# Patient Record
Sex: Male | Born: 1990 | Hispanic: Yes | Marital: Single | Smoking: Never smoker
Health system: Southern US, Community
[De-identification: ages and names within clinical notes are randomized; demographics above are authoritative.]

## PROBLEM LIST (undated history)

## (undated) DIAGNOSIS — J45909 Unspecified asthma, uncomplicated: Secondary | ICD-10-CM

## (undated) HISTORY — PX: ORIF ORBITAL FRACTURE: SHX5312

---

## 2012-08-27 ENCOUNTER — Emergency Department (HOSPITAL_COMMUNITY)
Admission: EM | Admit: 2012-08-27 | Discharge: 2012-08-27 | Disposition: A | Discharge status: Home / Self Care | Payer: 59 | Attending: Emergency Medicine | Admitting: Emergency Medicine

## 2012-08-27 ENCOUNTER — Encounter (HOSPITAL_COMMUNITY): Payer: Self-pay | Admitting: *Deleted

## 2012-08-27 DIAGNOSIS — L819 Disorder of pigmentation, unspecified: Secondary | ICD-10-CM | POA: Insufficient documentation

## 2012-08-27 DIAGNOSIS — T7840XA Allergy, unspecified, initial encounter: Secondary | ICD-10-CM | POA: Insufficient documentation

## 2012-08-27 DIAGNOSIS — Y929 Unspecified place or not applicable: Secondary | ICD-10-CM | POA: Insufficient documentation

## 2012-08-27 DIAGNOSIS — Y939 Activity, unspecified: Secondary | ICD-10-CM | POA: Insufficient documentation

## 2012-08-27 DIAGNOSIS — R21 Rash and other nonspecific skin eruption: Secondary | ICD-10-CM | POA: Insufficient documentation

## 2012-08-27 DIAGNOSIS — T65891A Toxic effect of other specified substances, accidental (unintentional), initial encounter: Secondary | ICD-10-CM | POA: Insufficient documentation

## 2012-08-27 MED ORDER — PREDNISONE 20 MG PO TABS: 40.0000 mg | ORAL_TABLET | Freq: Every day | ORAL | Status: DC

## 2012-08-27 MED ORDER — FAMOTIDINE 20 MG PO TABS: 20.0000 mg | ORAL_TABLET | Freq: Two times a day (BID) | ORAL | Status: DC

## 2012-08-27 MED ORDER — HYDROCORTISONE 2.5 % EX LOTN: TOPICAL_LOTION | Freq: Two times a day (BID) | CUTANEOUS | Status: DC

## 2012-08-27 MED ORDER — DIPHENHYDRAMINE HCL 25 MG PO CAPS: 25.0000 mg | ORAL_CAPSULE | Freq: Four times a day (QID) | ORAL | Status: DC | PRN

## 2012-08-27 NOTE — ED Notes (Signed)
Pt got a tattoo to left forearm two wks ago; noticed redness x 1 wk; bruising noted

## 2012-08-27 NOTE — ED Provider Notes (Signed)
History     CSN: [id number]  Arrival date & time 08/27/12  1909   First MD Initiated Contact with Patient 08/27/12 2056      Chief Complaint  Patient presents with  . tattoo redness     (Consider location/radiation/quality/duration/timing/severity/associated sxs/prior treatment) HPI Comments: Patient presenting with a rash of the left forearm in the area of his tattoo.  Patient got the tattoo two weeks ago and first noticed the rash one week ago.  Rash is localized to the area of the tattoo.  He reports that the area does itch, but is not painful.  He has been applying A&D ointment to the tattoo for the past two weeks.  He has had previous tattoos and has never had a reaction like this before.  He denies fever or chills.  No warmth or swelling of the skin.  He has not had any treatment for the rash prior to arrival.    The history is provided by the patient.    History reviewed. No pertinent past medical history.  History reviewed. No pertinent past surgical history.  No family history on file.  History  Substance Use Topics  . Smoking status: Never Smoker   . Smokeless tobacco: Not on file  . Alcohol Use: Yes     Comment: social      Review of Systems  Constitutional: Negative for fever and chills.  Skin: Positive for rash.  All other systems reviewed and are negative.    Allergies  Review of patient's allergies indicates no known allergies.  Home Medications   Current Outpatient Rx  Name  Route  Sig  Dispense  Refill  . acetaminophen (TYLENOL) 500 MG tablet   Oral   Take 1,000 mg by mouth every 6 (six) hours as needed for pain.         . bacitracin ointment   Topical   Apply 1 application topically 2 (two) times daily. Applied to tattoo.           BP 114/59  Pulse 75  Temp(Src) 98.9 F (37.2 C) (Oral)  Resp 18  SpO2 100%  Physical Exam  Nursing note and vitals reviewed. Constitutional: He appears well-developed and well-nourished.  HENT:   Head: Normocephalic and atraumatic.  Mouth/Throat: Oropharynx is clear and moist.  Neck: Normal range of motion. Neck supple.  Cardiovascular: Normal rate, regular rhythm and normal heart sounds.   Pulmonary/Chest: Effort normal and breath sounds normal.  Musculoskeletal: Normal range of motion.  Neurological: He is alert.  Skin: Skin is warm.  Tattoo of left forearm with erythema surrounding each section of ink.  Papular erythematous rash present in the middle of the tattoo.  Rash pruritic.  No warmth or swelling of the area.    Psychiatric: He has a normal mood and affect.    ED Course  Procedures (including critical care time)  Labs Reviewed - No data to display No results found.   No diagnosis found.    MDM  Patient presenting with what appears to be an allergic reaction in the area of a tattoo on his left forearm.  No evidence of Cellulitis at this time.  Patient instructed to take Pepcid, Benadryl, and also given prescription for Prednisone.  Patient also instructed to use Hydrocortisone lotion if area not improving after a few days of Prednisone.  Patient verbalizes understanding.  Return precautions discussed.        Pascal Lux [place], PA-C 08/28/12 2221

## 2012-08-30 NOTE — ED Provider Notes (Signed)
Medical screening examination/treatment/procedure(s) were performed by non-physician practitioner and as supervising physician I was immediately available for consultation/collaboration.   Kevin M Campos, MD 08/30/12 0725 

## 2014-03-05 ENCOUNTER — Emergency Department (HOSPITAL_COMMUNITY)
Admission: EM | Admit: 2014-03-05 | Discharge: 2014-03-05 | Disposition: A | Discharge status: Home / Self Care | Payer: 59 | Attending: Emergency Medicine | Admitting: Emergency Medicine

## 2014-03-05 ENCOUNTER — Encounter (HOSPITAL_COMMUNITY): Payer: Self-pay | Admitting: Emergency Medicine

## 2014-03-05 DIAGNOSIS — Z792 Long term (current) use of antibiotics: Secondary | ICD-10-CM | POA: Insufficient documentation

## 2014-03-05 DIAGNOSIS — R519 Headache, unspecified: Secondary | ICD-10-CM

## 2014-03-05 DIAGNOSIS — Z79899 Other long term (current) drug therapy: Secondary | ICD-10-CM | POA: Insufficient documentation

## 2014-03-05 DIAGNOSIS — R51 Headache: Secondary | ICD-10-CM | POA: Insufficient documentation

## 2014-03-05 DIAGNOSIS — J3489 Other specified disorders of nose and nasal sinuses: Secondary | ICD-10-CM | POA: Insufficient documentation

## 2014-03-05 DIAGNOSIS — IMO0002 Reserved for concepts with insufficient information to code with codable children: Secondary | ICD-10-CM | POA: Insufficient documentation

## 2014-03-05 HISTORY — DX: Unspecified asthma, uncomplicated: J45.909

## 2014-03-05 MED ORDER — GUAIFENESIN 100 MG/5ML PO LIQD: 100.0000 mg | ORAL | Status: DC | PRN

## 2014-03-05 NOTE — ED Provider Notes (Signed)
CSN: [id number]     Arrival date & time 03/05/14  1122 History  This chart was scribed for Andrew Horseman, PA, working with Purvis Sheffield, MD found by Elon Spanner, ED Scribe. This patient was seen in room WTR6/WTR6 and the patient's care was started at 12:24 PM.   Chief Complaint  Patient presents with  . Facial Pain    pain on r/"cheek bone". Hx of fracture -r/orbit   The history is provided by the patient. No language interpreter was used.    HPI Comments: Andrew Sanford is a 23 y.o. male who presents to the Emergency Department with a chief complaint of right orbital and right sinus pain onset 6 weeks ago.  Patient reports a "tingling" sensation in the area of complaint.  Patient reports associated right-nostril only rhinorrhea onset with activity.  Patient states he has started using new safety glasses at his job but states this may not be the cause because he always wears some type of eye protection.  Patient has a history of right orbital surgical repair three years ago in [place]  Patient denies fever, injury, sore throat, cough, chills.    No past medical history on file. No past surgical history on file. No family history on file. History  Substance Use Topics  . Smoking status: Never Smoker   . Smokeless tobacco: Not on file  . Alcohol Use: Yes     Comment: social    Review of Systems  Constitutional: Negative for fever and chills.  HENT: Positive for rhinorrhea. Negative for sore throat.   Eyes: Negative for visual disturbance.  Respiratory: Negative for cough.   Neurological: Positive for numbness.      Allergies  Review of patient's allergies indicates no known allergies.  Home Medications   Prior to Admission medications   Medication Sig Start Date End Date Taking? Authorizing Provider  acetaminophen (TYLENOL) 500 MG tablet Take 1,000 mg by mouth every 6 (six) hours as needed for pain.    Historical Provider, MD  bacitracin ointment Apply 1  application topically 2 (two) times daily. Applied to tattoo.    Historical Provider, MD  diphenhydrAMINE (BENADRYL) 25 mg capsule Take 1 capsule (25 mg total) by mouth every 6 (six) hours as needed for itching. 08/27/12   Heather Laisure, PA-C  famotidine (PEPCID) 20 MG tablet Take 1 tablet (20 mg total) by mouth 2 (two) times daily. 08/27/12   Santiago Glad, PA-C  hydrocortisone 2.5 % lotion Apply topically 2 (two) times daily. 08/27/12   Heather Laisure, PA-C  predniSONE (DELTASONE) 20 MG tablet Take 2 tablets (40 mg total) by mouth daily. 08/27/12   Heather Laisure, PA-C   BP 122/73  Pulse 58  Temp(Src) 98 F (36.7 C) (Oral)  Resp 16  SpO2 99% Physical Exam  Nursing note and vitals reviewed. Constitutional: He is oriented to person, place, and time. He appears well-developed and well-nourished. No distress.  HENT:  Head: Normocephalic and atraumatic.  Bilateral nares are clear, no septal hematoma, tenderness to palpation over right maxillary sinus, no bony abnormality or deformity, normal sensation.    Eyes: Conjunctivae and EOM are normal.  Neck: Neck supple. No tracheal deviation present.  Cardiovascular: Normal rate, regular rhythm and normal heart sounds.   Pulmonary/Chest: Effort normal and breath sounds normal. No respiratory distress. He has no wheezes. He has no rales.  Musculoskeletal: Normal range of motion.  Neurological: He is alert and oriented to person, place, and time.  Skin: Skin is warm  and dry.  Psychiatric: He has a normal mood and affect. His behavior is normal.    ED Course  Procedures (including critical care time)  DIAGNOSTIC STUDIES: Oxygen Saturationh is 99% on RA, normal by my interpretation.    COORDINATION OF CARE:  12:31 PM Discussed treatment plan with patient at bedside.  Patient acknowledges and agrees with plan.    Labs Review Labs Reviewed - No data to display  Imaging Review No results found.   EKG Interpretation None      MDM    Final diagnoses:  Facial pain    Patient with tingling sensation on the right side of his face, mainly under the eye.  No EOM entrapment or concerning symptoms, no sign of infection.  No vision changes or headaches.  Discussed with Dr. Romeo Apple, who agrees with ENT follow-up. Patient is stable and ready for discharge.  I personally performed the services described in this documentation, which was scribed in my presence. The recorded information has been reviewed and is accurate.     Andrew Horseman, PA-C 03/05/14 1450

## 2014-03-05 NOTE — Discharge Instructions (Signed)

## 2014-03-05 NOTE — Progress Notes (Signed)
[facility] Community Liaison Stacy, ° °Did not get to see patient but will be sending information about [facility] Orange Card program to help patient establish primary care, using the address provided.  °

## 2014-03-05 NOTE — ED Provider Notes (Signed)
Medical screening examination/treatment/procedure(s) were performed by non-physician practitioner and as supervising physician I was immediately available for consultation/collaboration.   EKG Interpretation None        Lashana Spang, MD 03/05/14 1605 
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
## 2014-04-30 ENCOUNTER — Emergency Department (HOSPITAL_COMMUNITY): Payer: 59

## 2014-04-30 ENCOUNTER — Encounter (HOSPITAL_COMMUNITY): Payer: Self-pay | Admitting: Emergency Medicine

## 2014-04-30 ENCOUNTER — Emergency Department (HOSPITAL_COMMUNITY)
Admission: EM | Admit: 2014-04-30 | Discharge: 2014-04-30 | Disposition: A | Discharge status: Home / Self Care | Payer: Self-pay | Attending: Emergency Medicine | Admitting: Emergency Medicine

## 2014-04-30 DIAGNOSIS — Y9389 Activity, other specified: Secondary | ICD-10-CM | POA: Insufficient documentation

## 2014-04-30 DIAGNOSIS — S161XXA Strain of muscle, fascia and tendon at neck level, initial encounter: Secondary | ICD-10-CM | POA: Insufficient documentation

## 2014-04-30 DIAGNOSIS — K029 Dental caries, unspecified: Secondary | ICD-10-CM | POA: Insufficient documentation

## 2014-04-30 DIAGNOSIS — Y92410 Unspecified street and highway as the place of occurrence of the external cause: Secondary | ICD-10-CM | POA: Insufficient documentation

## 2014-04-30 DIAGNOSIS — Z7952 Long term (current) use of systemic steroids: Secondary | ICD-10-CM | POA: Insufficient documentation

## 2014-04-30 DIAGNOSIS — Z79899 Other long term (current) drug therapy: Secondary | ICD-10-CM | POA: Insufficient documentation

## 2014-04-30 DIAGNOSIS — S0083XA Contusion of other part of head, initial encounter: Secondary | ICD-10-CM | POA: Insufficient documentation

## 2014-04-30 DIAGNOSIS — Y998 Other external cause status: Secondary | ICD-10-CM | POA: Insufficient documentation

## 2014-04-30 DIAGNOSIS — J45909 Unspecified asthma, uncomplicated: Secondary | ICD-10-CM | POA: Insufficient documentation

## 2014-04-30 DIAGNOSIS — S0011XA Contusion of right eyelid and periocular area, initial encounter: Secondary | ICD-10-CM | POA: Insufficient documentation

## 2014-04-30 MED ORDER — NAPROXEN 500 MG PO TABS: 500.0000 mg | ORAL_TABLET | Freq: Two times a day (BID) | ORAL | Status: DC

## 2014-04-30 MED ORDER — CYCLOBENZAPRINE HCL 10 MG PO TABS: 10.0000 mg | ORAL_TABLET | Freq: Two times a day (BID) | ORAL | Status: DC | PRN

## 2014-04-30 NOTE — Discharge Instructions (Signed)
Take naprosyn for pain. Flexeril for muscle spasms. Follow up with your doctor as needed. Your x-rays are normal today.    Cervical Sprain A cervical sprain is when the tissues (ligaments) that hold the neck bones in place stretch or tear. HOME CARE   Put ice on the injured area.  Put ice in a plastic bag.  Place a towel between your skin and the bag.  Leave the ice on for 15-20 minutes, 3-4 times a day.  You may have been given a collar to wear. This collar keeps your neck from moving while you heal.  Do not take the collar off unless told by your doctor.  If you have long hair, keep it outside of the collar.  Ask your doctor before changing the position of your collar. You may need to change its position over time to make it more comfortable.  If you are allowed to take off the collar for cleaning or bathing, follow your doctor's instructions on how to do it safely.  Keep your collar clean by wiping it with mild soap and water. Dry it completely. If the collar has removable pads, remove them every 1-2 days to hand wash them with soap and water. Allow them to air dry. They should be dry before you wear them in the collar.  Do not drive while wearing the collar.  Only take medicine as told by your doctor.  Keep all doctor visits as told.  Keep all physical therapy visits as told.  Adjust your work station so that you have good posture while you work.  Avoid positions and activities that make your problems worse.  Warm up and stretch before being active. GET HELP IF:  Your pain is not controlled with medicine.  You cannot take less pain medicine over time as planned.  Your activity level does not improve as expected. GET HELP RIGHT AWAY IF:   You are bleeding.  Your stomach is upset.  You have an allergic reaction to your medicine.  You develop new problems that you cannot explain.  You lose feeling (become numb) or you cannot move any part of your body  (paralysis).  You have tingling or weakness in any part of your body.  Your symptoms get worse. Symptoms include:  Pain, soreness, stiffness, puffiness (swelling), or a burning feeling in your neck.  Pain when your neck is touched.  Shoulder or upper back pain.  Limited ability to move your neck.  Headache.  Dizziness.  Your hands or arms feel week, lose feeling, or tingle.  Muscle spasms.  Difficulty swallowing or chewing. MAKE SURE YOU:   Understand these instructions.  Will watch your condition.  Will get help right away if you are not doing well or get worse. Document Released: 11/28/2007 Document Revised: 02/11/2013 Document Reviewed: 12/17/2012 [facility] Patient Information 2015 [place], [place]. This information is not intended to replace advice given to you by your health care provider. Make sure you discuss any questions you have with your health care provider.   Contusion A contusion is a deep bruise. Contusions are the result of an injury that caused bleeding under the skin. The contusion may turn blue, purple, or yellow. Minor injuries will give you a painless contusion, but more severe contusions may stay painful and swollen for a few weeks.  CAUSES  A contusion is usually caused by a blow, trauma, or direct force to an area of the body. SYMPTOMS   Swelling and redness of the injured area.  Bruising of the injured area.  Tenderness and soreness of the injured area.  Pain. DIAGNOSIS  The diagnosis can be made by taking a history and physical exam. An X-ray, CT scan, or MRI may be needed to determine if there were any associated injuries, such as fractures. TREATMENT  Specific treatment will depend on what area of the body was injured. In general, the best treatment for a contusion is resting, icing, elevating, and applying cold compresses to the injured area. Over-the-counter medicines may also be recommended for pain control. Ask your caregiver what the  best treatment is for your contusion. HOME CARE INSTRUCTIONS   Put ice on the injured area.  Put ice in a plastic bag.  Place a towel between your skin and the bag.  Leave the ice on for 15-20 minutes, 3-4 times a day, or as directed by your health care provider.  Only take over-the-counter or prescription medicines for pain, discomfort, or fever as directed by your caregiver. Your caregiver may recommend avoiding anti-inflammatory medicines (aspirin, ibuprofen, and naproxen) for 48 hours because these medicines may increase bruising.  Rest the injured area.  If possible, elevate the injured area to reduce swelling. SEEK IMMEDIATE MEDICAL CARE IF:   You have increased bruising or swelling.  You have pain that is getting worse.  Your swelling or pain is not relieved with medicines. MAKE SURE YOU:   Understand these instructions.  Will watch your condition.  Will get help right away if you are not doing well or get worse. Document Released: 03/21/2005 Document Revised: 06/16/2013 Document Reviewed: 04/16/2011 [facility]moreExitCare Patient Information 2015 [place], [place]. This information is not intended to replace advice given to you by your health care provider. Make sure you discuss any questions you have with your health care provider.
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
## 2014-08-23 ENCOUNTER — Emergency Department (HOSPITAL_COMMUNITY)
Admission: EM | Admit: 2014-08-23 | Discharge: 2014-08-23 | Disposition: A | Discharge status: Home / Self Care | Payer: 59

## 2014-08-23 NOTE — ED Notes (Signed)
Pt came to desk stating that he needed to get his wife and would come back at later time.

## 2014-08-31 ENCOUNTER — Encounter (HOSPITAL_COMMUNITY): Payer: Self-pay | Admitting: Emergency Medicine

## 2014-08-31 ENCOUNTER — Emergency Department (HOSPITAL_COMMUNITY): Payer: BLUE CROSS/BLUE SHIELD

## 2014-08-31 ENCOUNTER — Emergency Department (HOSPITAL_COMMUNITY)
Admission: EM | Admit: 2014-08-31 | Discharge: 2014-08-31 | Disposition: A | Discharge status: Home / Self Care | Payer: BLUE CROSS/BLUE SHIELD | Attending: Emergency Medicine | Admitting: Emergency Medicine

## 2014-08-31 DIAGNOSIS — Z7952 Long term (current) use of systemic steroids: Secondary | ICD-10-CM | POA: Diagnosis not present

## 2014-08-31 DIAGNOSIS — Z79899 Other long term (current) drug therapy: Secondary | ICD-10-CM | POA: Insufficient documentation

## 2014-08-31 DIAGNOSIS — Z791 Long term (current) use of non-steroidal anti-inflammatories (NSAID): Secondary | ICD-10-CM | POA: Insufficient documentation

## 2014-08-31 DIAGNOSIS — R002 Palpitations: Secondary | ICD-10-CM

## 2014-08-31 DIAGNOSIS — J45909 Unspecified asthma, uncomplicated: Secondary | ICD-10-CM | POA: Diagnosis not present

## 2014-08-31 LAB — I-STAT CHEM 8, ED
BUN: 24 mg/dL — ABNORMAL HIGH (ref 6–23)
Calcium, Ion: 1.23 mmol/L (ref 1.12–1.23)
Chloride: 100 mmol/L (ref 96–112)
Creatinine, Ser: 1.1 mg/dL (ref 0.50–1.35)
Glucose, Bld: 82 mg/dL (ref 70–99)
HCT: 45 % (ref 39.0–52.0)
Hemoglobin: 15.3 g/dL (ref 13.0–17.0)
Potassium: 3.6 mmol/L (ref 3.5–5.1)
Sodium: 140 mmol/L (ref 135–145)
TCO2: 25 mmol/L (ref 0–100)

## 2014-08-31 LAB — I-STAT TROPONIN, ED: Troponin i, poc: 0 ng/mL (ref 0.00–0.08)

## 2014-08-31 NOTE — ED Notes (Signed)
Pt c/o heart palpitations, ears will turn red and around his eyes will turn red also intermittently x 2 weeks.

## 2014-08-31 NOTE — Discharge Instructions (Signed)
Please follow up with cardiology office for further evaluation of your heart palpitations.  Palpitations A palpitation is the feeling that your heartbeat is irregular or is faster than normal. It may feel like your heart is fluttering or skipping a beat. Palpitations are usually not a serious problem. However, in some cases, you may need further medical evaluation. CAUSES  Palpitations can be caused by:  Smoking.  Caffeine or other stimulants, such as diet pills or energy drinks.  Alcohol.  Stress and anxiety.  Strenuous physical activity.  Fatigue.  Certain medicines.  Heart disease, especially if you have a history of irregular heart rhythms (arrhythmias), such as atrial fibrillation, atrial flutter, or supraventricular tachycardia.  An improperly working pacemaker or defibrillator. DIAGNOSIS  To find the cause of your palpitations, your health care provider will take your medical history and perform a physical exam. Your health care provider may also have you take a test called an ambulatory electrocardiogram (ECG). An ECG records your heartbeat patterns over a 24-hour period. You may also have other tests, such as:  Transthoracic echocardiogram (TTE). During echocardiography, sound waves are used to evaluate how blood flows through your heart.  Transesophageal echocardiogram (TEE).  Cardiac monitoring. This allows your health care provider to monitor your heart rate and rhythm in real time.  Holter monitor. This is a portable device that records your heartbeat and can help diagnose heart arrhythmias. It allows your health care provider to track your heart activity for several days, if needed.  Stress tests by exercise or by giving medicine that makes the heart beat faster. TREATMENT  Treatment of palpitations depends on the cause of your symptoms and can vary greatly. Most cases of palpitations do not require any treatment other than time, relaxation, and monitoring your  symptoms. Other causes, such as atrial fibrillation, atrial flutter, or supraventricular tachycardia, usually require further treatment. HOME CARE INSTRUCTIONS   Avoid:  Caffeinated coffee, tea, soft drinks, diet pills, and energy drinks.  Chocolate.  Alcohol.  Stop smoking if you smoke.  Reduce your stress and anxiety. Things that can help you relax include:  A method of controlling things in your body, such as your heartbeats, with your mind (biofeedback).  Yoga.  Meditation.  Physical activity such as swimming, jogging, or walking.  Get plenty of rest and sleep. SEEK MEDICAL CARE IF:   You continue to have a fast or irregular heartbeat beyond 24 hours.  Your palpitations occur more often. SEEK IMMEDIATE MEDICAL CARE IF:  You have chest pain or shortness of breath.  You have a severe headache.  You feel dizzy or you faint. MAKE SURE YOU:  Understand these instructions.  Will watch your condition.  Will get help right away if you are not doing well or get worse. Document Released: 06/08/2000 Document Revised: 06/16/2013 Document Reviewed: 08/10/2011 [facility] Patient Information 2015 [place], [place]. This information is not intended to replace advice given to you by your health care provider. Make sure you discuss any questions you have with your health care provider. Holter Monitoring A Holter monitor is a small device with electrodes (small sticky patches) that attach to your chest. It records the electrical activity of your heart and is worn continuously for 24-48 hours.  A HOLTER MONITOR IS USED TO  Detect heart problems such as:  Heart arrhythmia. Is an abnormal or irregular heartbeat. With some heart arrhythmias, you may not feel or know that you have an irregular heart rhythm.  Palpitations, such as feeling your heart  racing or fluttering. It is possible to have heart palpitations and not have a heart arrhythmia.  A heart rhythm that is too slow or too  fast.  If you have problems fainting, near fainting or feeling light-headed, a Holter monitor may be worn to see if your heart is the cause. HOLTER MONITOR PREPARATION   Electrodes will be attached to the skin on your chest.  If you have hair on your chest, small areas may have to be shaved. This is done to help the patches stick better and make the recording more accurate.  The electrodes are attached by wires to the Holter monitor. The Holter monitor clips to your clothing. You will wear the monitor at all times, even while exercising and sleeping. HOME CARE INSTRUCTIONS   Wear your monitor at all times.  The wires and the monitor must stay dry. Do not get the monitor wet.  Do not bathe, swim or use a hot tub with it on.  You may do a "sponge" bath while you have the monitor on.  Keep your skin clean, do not put body lotion or moisturizer on your chest.  It's possible that your skin under the electrodes could become irritated. To keep this from happening, you may put the electrodes in slightly different places on your chest.  Your caregiver will also ask you to keep a diary of your activities, such as walking or doing chores. Be sure to note what you are doing if you experience heart symptoms such as palpitations. This will help your caregiver determine what might be contributing to your symptoms. The information stored in your monitor will be reviewed by your caregiver alongside your diary entries.  Make sure the monitor is safely clipped to your clothing or in a location close to your body that your caregiver recommends.  The monitor and electrodes are removed when the test is over. Return the monitor as directed.  Be sure to follow up with your caregiver and discuss your Holter monitor results. SEEK IMMEDIATE MEDICAL CARE IF:  You faint or feel lightheaded.  You have trouble breathing.  You get pain in your chest, upper arm or jaw.  You feel sick to your stomach and your  skin is pale, cool, or damp.  You think something is wrong with the way your heart is beating. MAKE SURE YOU:   Understand these instructions.  Will watch your condition.  Will get help right away if you are not doing well or get worse. Document Released: 03/09/2004 Document Revised: 09/03/2011 Document Reviewed: 07/22/2008 [facility] Patient Information 2015 [place]. This information is not intended to replace advice given to you by your health care provider. Make sure you discuss any questions you have with your health care provider.

## 2014-08-31 NOTE — ED Provider Notes (Signed)
CSN: [id number]     Arrival date & time 08/31/14  1626 History   First MD Initiated Contact with Patient 08/31/14 1840     Chief Complaint  Patient presents with  . Palpitations     (Consider location/radiation/quality/duration/timing/severity/associated sxs/prior Treatment) HPI   24 year old male with history of asthma presenting for evaluation of heart palpitation. Patient reports he has been expressing intermittent heart palpitation that has worsened in the past 2 weeks. Describes symptoms as heart beating fast, a first sensation to his face, and feeling very fatigued. Episode usually triggers by exertion, and lasting for seconds to minutes and improved with rest. Most recent episode was today while he was working with the machine at work. He admits that he did drink energy drinks at least 1-2 bottle daily but since his heart palpitation has increased in frequency he has quit for the past 2 weeks. He denies caffeine use. He denies any other medication changes. He has childhood asthma but has not had a flare recently. He did report a strong family history of heart palpitation in his brother and his father, both on medication for that. He denies having any fever, headache, vision changes, pleuritic chest pain, abdominal pain, numbness or weakness. He has no symptoms at this time. Denies any prior history of exertional syncope. Patient does not have a primary care doctor and has never been evaluated for this in the past.    Past Medical History  Diagnosis Date  . Asthma    Past Surgical History  Procedure Laterality Date  . Orif orbital fracture     Family History  Problem Relation Age of Onset  . Hypertension Father   . Hypertension Brother   . Diabetes Other    History  Substance Use Topics  . Smoking status: Never Smoker   . Smokeless tobacco: Not on file  . Alcohol Use: Yes    Review of Systems  All other systems reviewed and are negative.     Allergies  Review of  patient's allergies indicates no known allergies.  Home Medications   Prior to Admission medications   Medication Sig Start Date End Date Taking? Authorizing Provider  acetaminophen (TYLENOL) 500 MG tablet Take 1,000 mg by mouth every 6 (six) hours as needed for pain.    Historical Provider, MD  bacitracin ointment Apply 1 application topically 2 (two) times daily. Applied to tattoo.    Historical Provider, MD  cyclobenzaprine (FLEXERIL) 10 MG tablet Take 1 tablet (10 mg total) by mouth 2 (two) times daily as needed for muscle spasms. 04/30/14   Tatyana Kirichenko, PA-C  diphenhydrAMINE (BENADRYL) 25 mg capsule Take 1 capsule (25 mg total) by mouth every 6 (six) hours as needed for itching. 08/27/12   Heather Laisure, PA-C  famotidine (PEPCID) 20 MG tablet Take 1 tablet (20 mg total) by mouth 2 (two) times daily. 08/27/12   Heather Laisure, PA-C  guaiFENesin (ROBITUSSIN) 100 MG/5ML liquid Take 5-10 mLs (100-200 mg total) by mouth every 4 (four) hours as needed for cough. 03/05/14   Roxy Horseman, PA-C  hydrocortisone 2.5 % lotion Apply topically 2 (two) times daily. 08/27/12   Heather Laisure, PA-C  naproxen (NAPROSYN) 500 MG tablet Take 1 tablet (500 mg total) by mouth 2 (two) times daily. 04/30/14   Tatyana Kirichenko, PA-C  predniSONE (DELTASONE) 20 MG tablet Take 2 tablets (40 mg total) by mouth daily. 08/27/12   Heather Laisure, PA-C   BP 117/58 mmHg  Pulse 66  Temp(Src) 98.2 F (36.8  C) (Oral)  Resp 16  Ht 5\' 6"  (1.676 m)  Wt 156 lb (70.761 kg)  BMI 25.19 kg/m2  SpO2 100% Physical Exam  Constitutional: He is oriented to person, place, and time. He appears well-developed and well-nourished. No distress.  HENT:  Head: Atraumatic.  Eyes: Conjunctivae and EOM are normal. Pupils are equal, round, and reactive to light.  Neck: Normal range of motion. Neck supple.  Cardiovascular: Normal rate and regular rhythm.  Exam reveals no gallop and no friction rub.   No murmur heard. Pulmonary/Chest:  Effort normal and breath sounds normal.  Abdominal: Soft. There is no tenderness.  Musculoskeletal: Normal range of motion. He exhibits no edema.  Neurological: He is alert and oriented to person, place, and time.  Skin: No rash noted.  Psychiatric: He has a normal mood and affect.    ED Course  Procedures (including critical care time)  Patient is here for intermittent heart palpitation with a strong family history of heart palpitation. He is currently asymptomatic. He is hemodynamically stable, afebrile and no hypoxia. EKG shows no irregular heart rhythm, troponin is normal. He has a normal orthostatic vital sign, cxr normal.  Recommend pt to f/u with cardiology for outpt evaluation.  Suspect pt may need Holter monitoring if indicated.  Labs Review Labs Reviewed  Rosezena Sensor-STAT TROPOININ, ED    Imaging Review Dg Chest 2 View  08/31/2014   CLINICAL DATA:  heart palpitations, ears will turn red and around his eyes will turn red also intermittently x 2 weeks. Hx of asthma as a child.  EXAM: CHEST - 2 VIEW  COMPARISON:  None available  FINDINGS: Lungs are clear. Heart size and mediastinal contours are within normal limits. No effusion. Visualized skeletal structures are unremarkable.  IMPRESSION: No acute cardiopulmonary disease.   Electronically Signed   By: Corlis Leak  Hassell M.D.   On: 08/31/2014 20:08     EKG Interpretation   Date/Time:  Tuesday August 31 2014 17:01:52 EST Ventricular Rate:  66 PR Interval:  133 QRS Duration: 96 QT Interval:  401 QTC Calculation: 420 R Axis:   88 Text Interpretation:  Sinus rhythm Probable anteroseptal infarct, old No  old tracing to compare Confirmed by KNAPP  MD-J, JON ([id number]) on 08/31/2014  5:14:55 PM      MDM   Final diagnoses:  Intermittent palpitations  Palpitation    BP 119/63 mmHg  Pulse 63  Temp(Src) 98 F (36.7 C) (Oral)  Resp 16  Ht 5\' 6"  (1.676 m)  Wt 156 lb (70.761 kg)  BMI 25.19 kg/m2  SpO2 100%  I have reviewed nursing notes and  vital signs. I personally reviewed the imaging tests through PACS system  I reviewed available ER/hospitalization records thought the EMR     Fayrene HelperBowie Anna Beaird, PA-C 08/31/14 2042  Linwood DibblesJon Knapp, MD 08/31/14 2046

## 2015-04-15 ENCOUNTER — Emergency Department (HOSPITAL_COMMUNITY): Payer: BLUE CROSS/BLUE SHIELD

## 2015-04-15 ENCOUNTER — Encounter (HOSPITAL_COMMUNITY): Payer: Self-pay | Admitting: Emergency Medicine

## 2015-04-15 ENCOUNTER — Emergency Department (HOSPITAL_COMMUNITY)
Admission: EM | Admit: 2015-04-15 | Discharge: 2015-04-15 | Disposition: A | Discharge status: Home / Self Care | Payer: BLUE CROSS/BLUE SHIELD | Attending: Emergency Medicine | Admitting: Emergency Medicine

## 2015-04-15 DIAGNOSIS — Y998 Other external cause status: Secondary | ICD-10-CM | POA: Insufficient documentation

## 2015-04-15 DIAGNOSIS — Y9389 Activity, other specified: Secondary | ICD-10-CM | POA: Diagnosis not present

## 2015-04-15 DIAGNOSIS — S99911A Unspecified injury of right ankle, initial encounter: Secondary | ICD-10-CM | POA: Diagnosis present

## 2015-04-15 DIAGNOSIS — Y9289 Other specified places as the place of occurrence of the external cause: Secondary | ICD-10-CM | POA: Insufficient documentation

## 2015-04-15 DIAGNOSIS — J45909 Unspecified asthma, uncomplicated: Secondary | ICD-10-CM | POA: Diagnosis not present

## 2015-04-15 DIAGNOSIS — W1842XA Slipping, tripping and stumbling without falling due to stepping into hole or opening, initial encounter: Secondary | ICD-10-CM | POA: Diagnosis not present

## 2015-04-15 DIAGNOSIS — S93401A Sprain of unspecified ligament of right ankle, initial encounter: Secondary | ICD-10-CM | POA: Diagnosis not present

## 2015-04-15 MED ORDER — IBUPROFEN 800 MG PO TABS: 800.0000 mg | ORAL_TABLET | Freq: Three times a day (TID) | ORAL | Status: DC | PRN

## 2015-04-15 MED ORDER — HYDROCODONE-ACETAMINOPHEN 5-325 MG PO TABS: 1.0000 | ORAL_TABLET | Freq: Four times a day (QID) | ORAL | Status: AC | PRN
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
## 2015-04-15 NOTE — Discharge Instructions (Signed)
Return here as needed.  Follow-up with the orthopedist provided.  Ice and elevate your ankle. °
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
## 2015-08-01 ENCOUNTER — Emergency Department (HOSPITAL_COMMUNITY)
Admission: EM | Admit: 2015-08-01 | Discharge: 2015-08-02 | Disposition: A | Discharge status: Home / Self Care | Payer: 59 | Attending: Emergency Medicine | Admitting: Emergency Medicine

## 2015-08-01 ENCOUNTER — Encounter (HOSPITAL_COMMUNITY): Payer: Self-pay | Admitting: Emergency Medicine

## 2015-08-01 DIAGNOSIS — R109 Unspecified abdominal pain: Secondary | ICD-10-CM

## 2015-08-01 DIAGNOSIS — R1032 Left lower quadrant pain: Secondary | ICD-10-CM | POA: Insufficient documentation

## 2015-08-01 DIAGNOSIS — R509 Fever, unspecified: Secondary | ICD-10-CM | POA: Insufficient documentation

## 2015-08-01 DIAGNOSIS — R1031 Right lower quadrant pain: Secondary | ICD-10-CM | POA: Insufficient documentation

## 2015-08-01 DIAGNOSIS — J45909 Unspecified asthma, uncomplicated: Secondary | ICD-10-CM | POA: Insufficient documentation

## 2015-08-01 LAB — CBC
HCT: 43 % (ref 39.0–52.0)
HEMOGLOBIN: 14.1 g/dL (ref 13.0–17.0)
MCH: 27.6 pg (ref 26.0–34.0)
MCHC: 32.8 g/dL (ref 30.0–36.0)
MCV: 84.3 fL (ref 78.0–100.0)
Platelets: 274 10*3/uL (ref 150–400)
RBC: 5.1 MIL/uL (ref 4.22–5.81)
RDW: 12.9 % (ref 11.5–15.5)
WBC: 7.1 10*3/uL (ref 4.0–10.5)

## 2015-08-01 LAB — COMPREHENSIVE METABOLIC PANEL
ALT: 16 U/L — ABNORMAL LOW (ref 17–63)
ANION GAP: 10 (ref 5–15)
AST: 22 U/L (ref 15–41)
Albumin: 4.5 g/dL (ref 3.5–5.0)
Alkaline Phosphatase: 56 U/L (ref 38–126)
BUN: 13 mg/dL (ref 6–20)
CALCIUM: 9 mg/dL (ref 8.9–10.3)
CHLORIDE: 101 mmol/L (ref 101–111)
CO2: 25 mmol/L (ref 22–32)
Creatinine, Ser: 0.86 mg/dL (ref 0.61–1.24)
GFR calc non Af Amer: >60 mL/min (ref >60)
Glucose, Bld: 98 mg/dL (ref 65–99)
Potassium: 4 mmol/L (ref 3.5–5.1)
SODIUM: 136 mmol/L (ref 135–145)
Total Bilirubin: 0.4 mg/dL (ref 0.3–1.2)
Total Protein: 8.5 g/dL — ABNORMAL HIGH (ref 6.5–8.1)

## 2015-08-01 LAB — URINALYSIS, ROUTINE W REFLEX MICROSCOPIC
Bilirubin Urine: NEGATIVE
Glucose, UA: NEGATIVE mg/dL
HGB URINE DIPSTICK: NEGATIVE
Ketones, ur: NEGATIVE mg/dL
Leukocytes, UA: NEGATIVE
Nitrite: NEGATIVE
Protein, ur: NEGATIVE mg/dL
SPECIFIC GRAVITY, URINE: 1.017 (ref 1.005–1.030)
pH: 8 (ref 5.0–8.0)

## 2015-08-01 LAB — URINE MICROSCOPIC-ADD ON: RBC / HPF: NONE SEEN RBC/hpf (ref 0–5)

## 2015-08-01 LAB — LIPASE, BLOOD: LIPASE: 26 U/L (ref 11–51)

## 2015-08-01 NOTE — ED Notes (Signed)
Pt states right lower sided abdominal pain x 1 week, pain has now also developed in left lower quadrant. Denies N/V/D, pain worse with palpation, states has been having regular bowel movements. Also states he's having pain with urination since this started.

## 2015-08-02 ENCOUNTER — Encounter (HOSPITAL_COMMUNITY): Payer: Self-pay

## 2015-08-02 ENCOUNTER — Emergency Department (HOSPITAL_COMMUNITY): Payer: 59

## 2015-08-02 MED ORDER — IOHEXOL 300 MG/ML  SOLN
25.0000 mL | Freq: Once | INTRAMUSCULAR | Status: AC | PRN
  Administered 2015-08-02: 25 mL via ORAL

## 2015-08-02 MED ORDER — IOHEXOL 300 MG/ML  SOLN
100.0000 mL | Freq: Once | INTRAMUSCULAR | Status: AC | PRN
  Administered 2015-08-02: 100 mL via INTRAVENOUS

## 2015-08-02 MED ORDER — IBUPROFEN 800 MG PO TABS: 800.0000 mg | ORAL_TABLET | Freq: Three times a day (TID) | ORAL | Status: AC | PRN

## 2015-08-02 MED ORDER — SODIUM CHLORIDE 0.9 % IV SOLN
Freq: Once | INTRAVENOUS | Status: AC
  Administered 2015-08-02: 02:00:00 via INTRAVENOUS

## 2015-08-02 MED ORDER — ONDANSETRON HCL 4 MG/2ML IJ SOLN
4.0000 mg | Freq: Once | INTRAMUSCULAR | Status: AC
  Administered 2015-08-02: 4 mg via INTRAVENOUS
  Filled 2015-08-02: qty 2

## 2015-08-02 MED ORDER — HYDROMORPHONE HCL 1 MG/ML IJ SOLN
1.0000 mg | Freq: Once | INTRAMUSCULAR | Status: AC
  Administered 2015-08-02: 1 mg via INTRAVENOUS
  Filled 2015-08-02: qty 1

## 2015-08-02 NOTE — ED Provider Notes (Signed)
CSN: [id number]     Arrival date & time 08/01/15  1745 History   First MD Initiated Contact with Patient 08/02/15 [phone]     Chief Complaint  Patient presents with  . Abdominal Pain  . Fever     (Consider location/radiation/quality/duration/timing/severity/associated sxs/prior Treatment) HPI Comments: Normally healthy 25 year old male who reports that he's had one week of right lower quadrant pain for the past 3 days.  He's also had left lower quadrant pain.  He reports no nausea vomiting diarrhea, no constipation, no dysuria, no trauma, no testicular pain or swelling.  No history of hernias .  He states when he walks he can feel the pain/discomfort in his right lower quadrant predominantly.  Denies any fever  Patient is a 25 y.o. male presenting with abdominal pain and fever. The history is provided by the patient.  Abdominal Pain Pain location:  LLQ and RLQ Pain quality: aching   Pain radiates to:  Does not radiate Pain severity:  Moderate Onset quality:  Gradual Duration:  1 week Timing:  Constant Progression:  Worsening Chronicity:  New Context: not diet changes, not eating, not laxative use, not medication withdrawal, not previous surgeries, not recent illness, not recent sexual activity, not recent travel, not retching, not sick contacts, not suspicious food intake and not trauma   Relieved by:  Nothing Associated symptoms: fever   Associated symptoms: no chills, no constipation, no diarrhea, no dysuria, no nausea, no shortness of breath and no vomiting   Fever Associated symptoms: no chills, no diarrhea, no dysuria, no nausea, no rash and no vomiting     Past Medical History  Diagnosis Date  . Asthma    Past Surgical History  Procedure Laterality Date  . Orif orbital fracture     Family History  Problem Relation Age of Onset  . Hypertension Father   . Hypertension Brother   . Diabetes Other    Social History  Substance Use Topics  . Smoking status: Never Smoker    . Smokeless tobacco: None  . Alcohol Use: Yes    Review of Systems  Constitutional: Positive for fever. Negative for chills.  Respiratory: Negative for shortness of breath.   Gastrointestinal: Positive for abdominal pain. Negative for nausea, vomiting, diarrhea and constipation.  Genitourinary: Negative for dysuria, flank pain, discharge, penile swelling, scrotal swelling, genital sores and testicular pain.  Skin: Negative for rash.  All other systems reviewed and are negative.     Allergies  Review of patient's allergies indicates no known allergies.  Home Medications   Prior to Admission medications   Medication Sig Start Date End Date Taking? Authorizing Provider  HYDROcodone-acetaminophen (NORCO/VICODIN) 5-325 MG tablet Take 1 tablet by mouth every 6 (six) hours as needed for moderate pain. Patient not taking: Reported on 08/02/2015 04/15/15   Charlestine Night, PA-C  ibuprofen (ADVIL,MOTRIN) 800 MG tablet Take 1 tablet (800 mg total) by mouth every 8 (eight) hours as needed. 08/02/15   Earley Favor, NP   BP 141/86 mmHg  Pulse 84  Temp(Src) 100.2 F (37.9 C) (Oral)  Resp 16  SpO2 100% Physical Exam  Constitutional: He is oriented to person, place, and time. He appears well-developed and well-nourished.  Eyes: Pupils are equal, round, and reactive to light.  Neck: Normal range of motion.  Cardiovascular: Normal rate and regular rhythm.   Pulmonary/Chest: Effort normal and breath sounds normal.  Abdominal: Soft. He exhibits no distension. There is tenderness in the right lower quadrant and left lower quadrant. There  is no rigidity, no rebound, no guarding and no CVA tenderness. No hernia. Hernia confirmed negative in the right inguinal area and confirmed negative in the left inguinal area.  Genitourinary: Penis normal. Right testis shows no swelling and no tenderness. Left testis shows no swelling and no tenderness. No penile tenderness.  Musculoskeletal: Normal range of  motion.  Neurological: He is alert and oriented to person, place, and time.  Skin: Skin is warm and dry. No rash noted.  Nursing note and vitals reviewed.   ED Course  Procedures (including critical care time) Labs Review Labs Reviewed  COMPREHENSIVE METABOLIC PANEL - Abnormal; Notable for the following:    Total Protein 8.5 (*)    ALT 16 (*)    All other components within normal limits  URINALYSIS, ROUTINE W REFLEX MICROSCOPIC (NOT AT [facility]) - Abnormal; Notable for the following:    APPearance TURBID (*)    All other components within normal limits  URINE MICROSCOPIC-ADD ON - Abnormal; Notable for the following:    Squamous Epithelial / LPF 0-5 (*)    Bacteria, UA RARE (*)    All other components within normal limits  LIPASE, BLOOD  CBC    Imaging Review Ct Abdomen Pelvis W Contrast  08/02/2015  CLINICAL DATA:  Acute onset right lower quadrant and left lower quadrant abdominal pain. Dysuria. Initial encounter. EXAM: CT ABDOMEN AND PELVIS WITH CONTRAST TECHNIQUE: Multidetector CT imaging of the abdomen and pelvis was performed using the standard protocol following bolus administration of intravenous contrast. CONTRAST:  OMNIPAQUE IOHEXOL 300 MG/ML  SOLN COMPARISON:  None. FINDINGS: The visualized lung bases are clear. The liver and spleen are unremarkable in appearance. The gallbladder is within normal limits. The pancreas and adrenal glands are unremarkable. A 3.8 cm cyst is noted at the interpole region of the right kidney. The kidneys are otherwise unremarkable. There is no evidence of hydronephrosis. No renal or ureteral stones are identified. No perinephric stranding is appreciated. No free fluid is identified. The small bowel is unremarkable in appearance. The stomach is within normal limits. No acute vascular abnormalities are seen. The appendix is normal in caliber, without evidence of appendicitis. The colon is grossly unremarkable in appearance. The bladder is mildly  distended and grossly unremarkable in appearance. The prostate remains normal in size. No inguinal lymphadenopathy is seen. No acute osseous abnormalities are identified. IMPRESSION: 1. No acute abnormality seen within the abdomen or pelvis. 2. Right renal cyst noted. Electronically Signed   By: Roanna Raider M.D.   On: 08/02/2015 02:34   I have personally reviewed and evaluated these images and lab results as part of my medical decision-making.   EKG Interpretation None      MDM   Final diagnoses:  Abdominal pain, unspecified abdominal location         Earley Favor, NP 08/02/15 0301  Layla Maw Ward, DO 08/02/15 0981

## 2015-08-02 NOTE — ED Notes (Signed)
Patient transported to CT 

## 2015-08-02 NOTE — Discharge Instructions (Signed)
Abdominal Pain, Adult Many things can cause belly (abdominal) pain. Most times, the belly pain is not dangerous. Many cases of belly pain can be watched and treated at home. HOME CARE   Do not take medicines that help you go poop (laxatives) unless told to by your doctor.  Only take medicine as told by your doctor.  Eat or drink as told by your doctor. Your doctor will tell you if you should be on a special diet. GET HELP IF:  You do not know what is causing your belly pain.  You have belly pain while you are sick to your stomach (nauseous) or have runny poop (diarrhea).  You have pain while you pee or poop.  Your belly pain wakes you up at night.  You have belly pain that gets worse or better when you eat.  You have belly pain that gets worse when you eat fatty foods.  You have a fever. GET HELP RIGHT AWAY IF:   The pain does not go away within 2 hours.  You keep throwing up (vomiting).  The pain changes and is only in the right or left part of the belly.  You have bloody or tarry looking poop. MAKE SURE YOU:   Understand these instructions.  Will watch your condition.  Will get help right away if you are not doing well or get worse.   This information is not intended to replace advice given to you by your health care provider. Make sure you discuss any questions you have with your health care provider.   Document Released: 11/28/2007 Document Revised: 07/02/2014 Document Reviewed: 02/18/2013 Elsevier Interactive Patient Education 2016 [employer]. Tonight you were evaluated for right and left lower quadrant pain with a CT scan normal.  Your lab value and urine are all normal. You have been given a prescription for ibuprofen please take this on a regular basis for the next several days if you develop new or worsening symptoms please return for further evaluation

## 2017-06-13 IMAGING — CT CT ABD-PELV W/ CM
2 of 4 series · 16 of 46 positions shown, 18 images · IV contrast (100 ML OMNI 300)
Comparison: None.

CLINICAL DATA: Acute onset right lower quadrant and left lower
quadrant abdominal pain. Dysuria. Initial encounter.

EXAM:
CT ABDOMEN AND PELVIS WITH CONTRAST
TECHNIQUE: Multidetector CT imaging of the abdomen and pelvis was performed
using the standard protocol following bolus administration of
intravenous contrast.
CONTRAST:  100mL OMNIPAQUE IOHEXOL 300 MG/ML  SOLN

[Series 2: abd/pel with · axial · 0.70mm/px · z∈[-550,-176]mm · 13 of 83 slices shown, 15 images]
[im 4/83  soft-tissue]
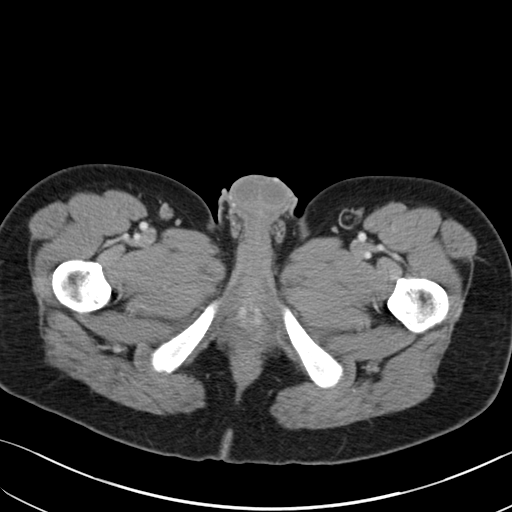
[im 4/83  bone]
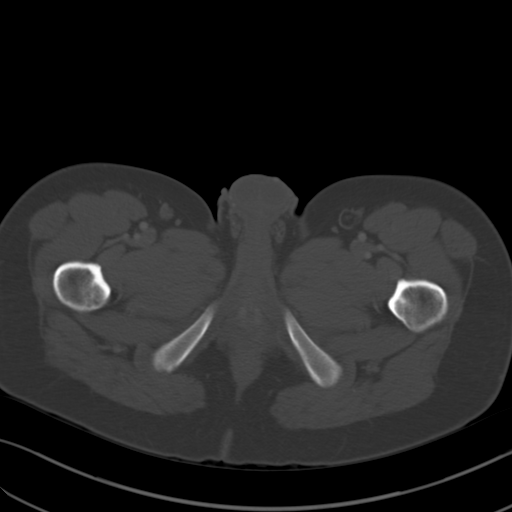
[im 10/83  soft-tissue]
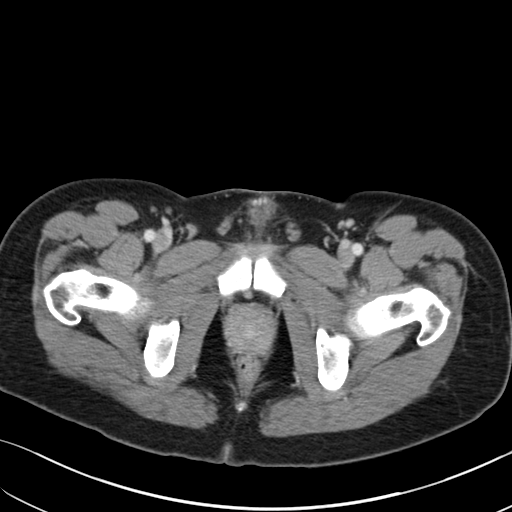
[im 16/83  soft-tissue]
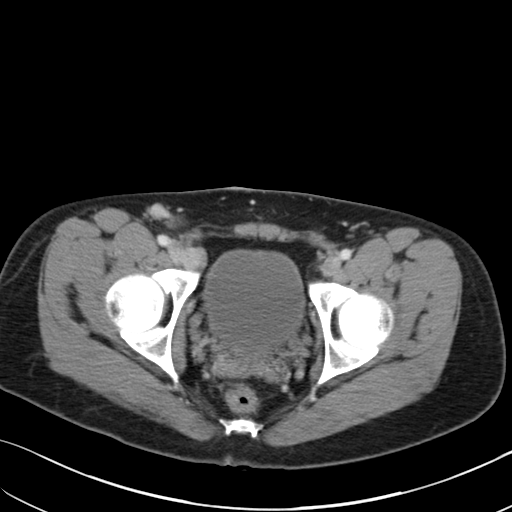
[im 23/83  soft-tissue]
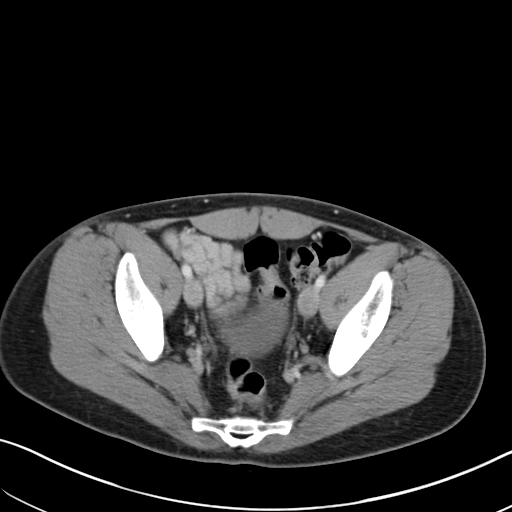
[im 29/83  soft-tissue]
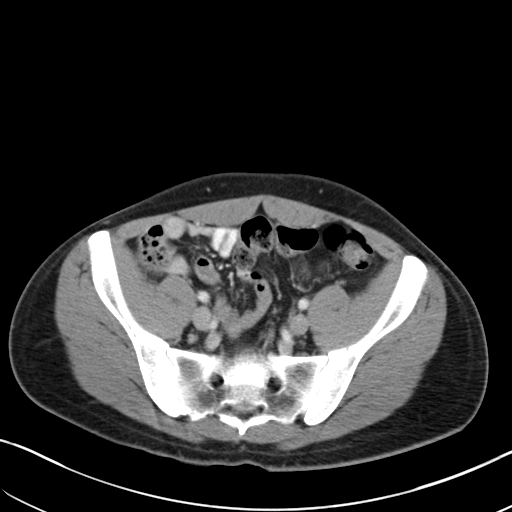
[im 35/83  soft-tissue]
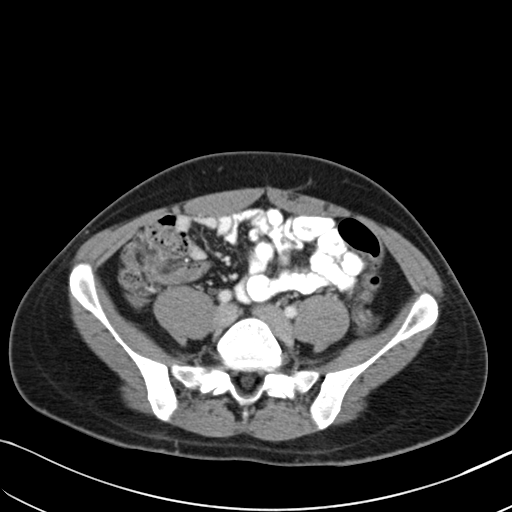
[im 42/83  soft-tissue]
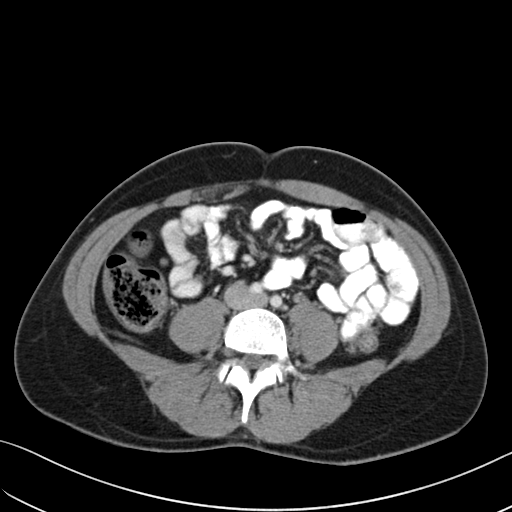
[im 48/83  soft-tissue]
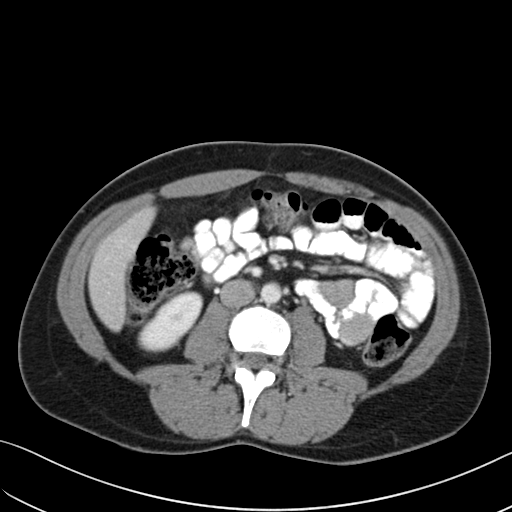
[im 54/83  soft-tissue]
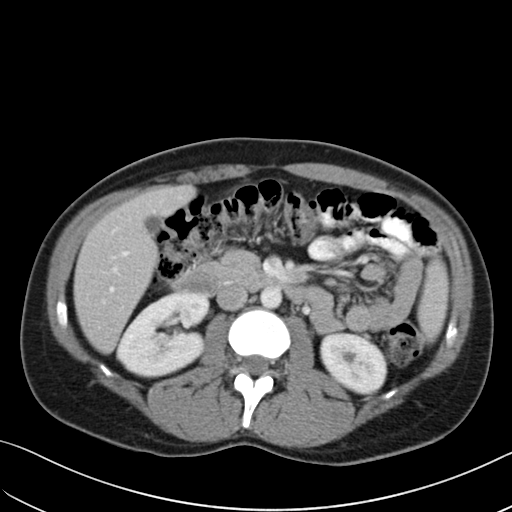
[im 54/83  bone]
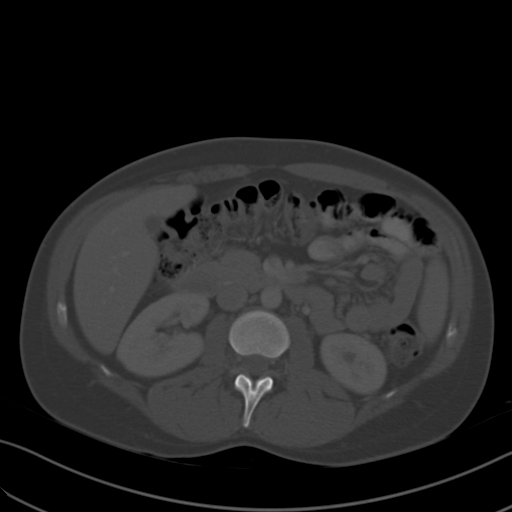
[im 60/83  soft-tissue]
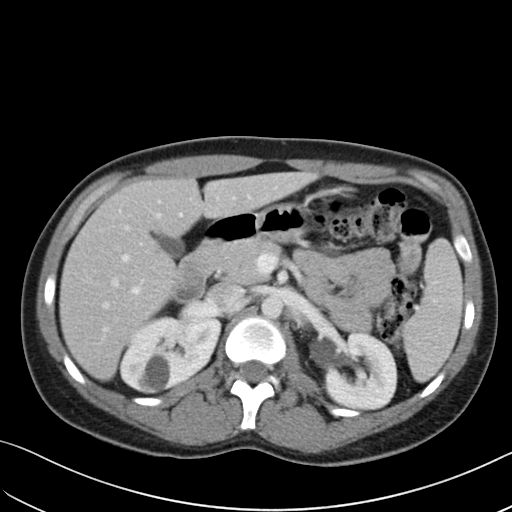
[im 67/83  soft-tissue]
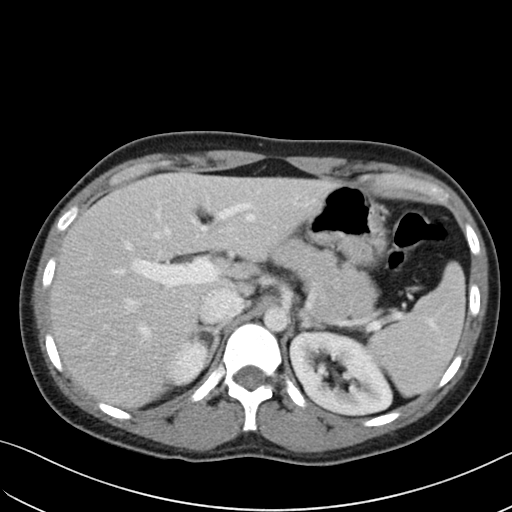
[im 73/83  soft-tissue]
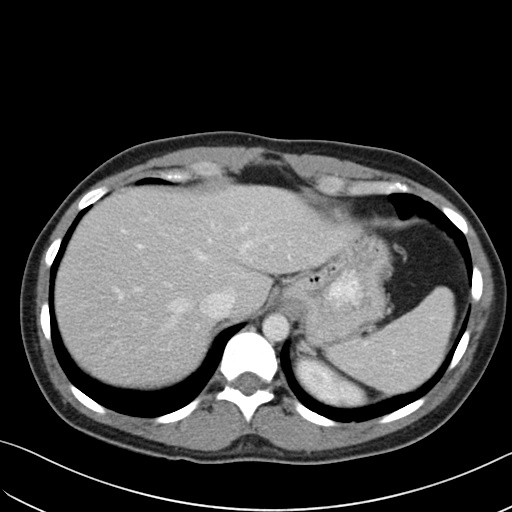
[im 79/83  soft-tissue]
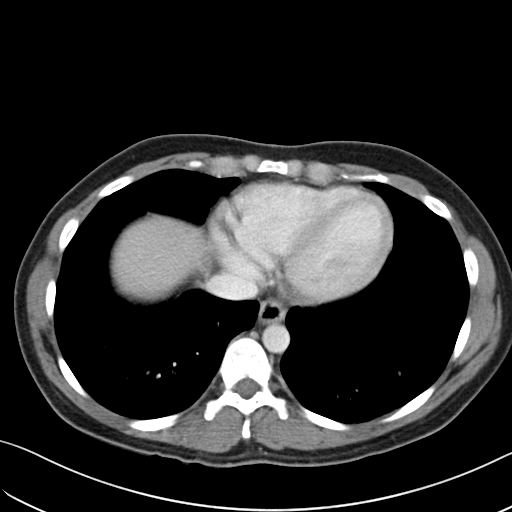

[Series 4: coronal a/|p · coronal · 0.63mm/px · 3 of 73 slices shown]
[im 25/73  soft-tissue]
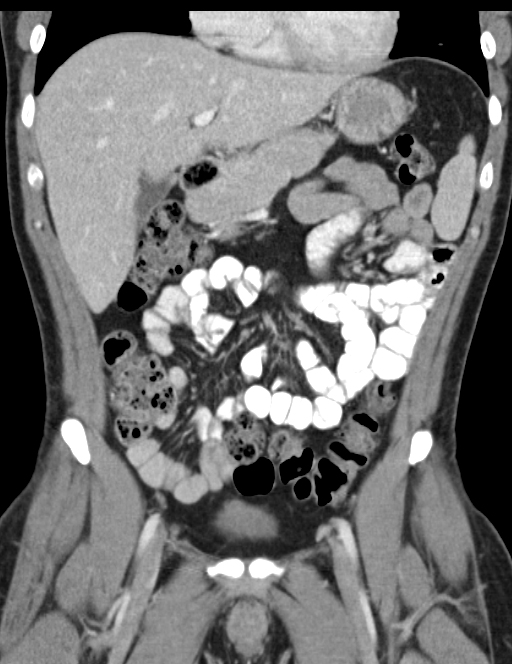
[im 33/73  soft-tissue]
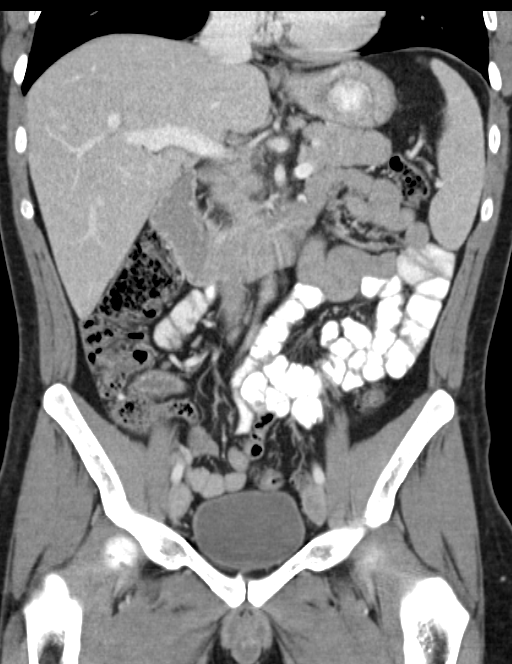
[im 41/73  soft-tissue]
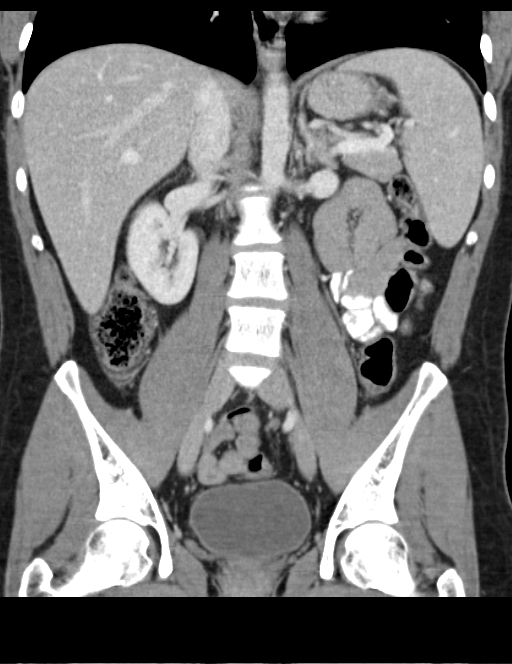

[16 of 46 positions shown; findings below may reference images not displayed]

FINDINGS: The visualized lung bases are clear.

The liver and spleen are unremarkable in appearance. The gallbladder
is within normal limits. The pancreas and adrenal glands are
unremarkable.

A 3.8 cm cyst is noted at the interpole region of the right kidney.
The kidneys are otherwise unremarkable. There is no evidence of
hydronephrosis. No renal or ureteral stones are identified. No
perinephric stranding is appreciated.

No free fluid is identified. The small bowel is unremarkable in
appearance. The stomach is within normal limits. No acute vascular
abnormalities are seen.

The appendix is normal in caliber, without evidence of appendicitis.
The colon is grossly unremarkable in appearance.

The bladder is mildly distended and grossly unremarkable in
appearance. The prostate remains normal in size. No inguinal
lymphadenopathy is seen.

No acute osseous abnormalities are identified.
IMPRESSION: 1. No acute abnormality seen within the abdomen or pelvis.
2. Right renal cyst noted.
